# Patient Record
Sex: Male | Born: 2000 | Race: White | Hispanic: No | Marital: Single | Smoking: Never smoker
Health system: Southern US, Community
[De-identification: ages and names within clinical notes are randomized; demographics above are authoritative.]

## PROBLEM LIST (undated history)

## (undated) DIAGNOSIS — S060XAA Concussion with loss of consciousness status unknown, initial encounter: Secondary | ICD-10-CM

## (undated) DIAGNOSIS — L0591 Pilonidal cyst without abscess: Secondary | ICD-10-CM

## (undated) HISTORY — DX: Concussion with loss of consciousness status unknown, initial encounter: S06.0XAA

## (undated) HISTORY — PX: TYMPANOSTOMY TUBE PLACEMENT: SHX32

---
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
## 2022-04-17 ENCOUNTER — Other Ambulatory Visit: Payer: Self-pay | Admitting: Psychiatry
